# Patient Record
Sex: Female | Born: 1960 | Race: White | Hispanic: No | Marital: Married | Smoking: Former smoker
Health system: Southern US, Community
[De-identification: ages and names within clinical notes are randomized; demographics above are authoritative.]

## PROBLEM LIST (undated history)

## (undated) DIAGNOSIS — G510 Bell's palsy: Secondary | ICD-10-CM

## (undated) DIAGNOSIS — K589 Irritable bowel syndrome without diarrhea: Secondary | ICD-10-CM

## (undated) HISTORY — PX: SHOULDER SURGERY: SHX246

## (undated) HISTORY — PX: ABDOMINAL HYSTERECTOMY: SHX81

---

## 2007-09-17 ENCOUNTER — Ambulatory Visit: Payer: Self-pay | Admitting: Internal Medicine

## 2009-10-30 ENCOUNTER — Ambulatory Visit: Payer: Self-pay | Admitting: Family Medicine

## 2015-12-27 ENCOUNTER — Ambulatory Visit
Admission: EM | Admit: 2015-12-27 | Discharge: 2015-12-27 | Disposition: A | Discharge status: Home / Self Care | Payer: 59 | Attending: Family Medicine | Admitting: Family Medicine

## 2015-12-27 ENCOUNTER — Encounter: Payer: Self-pay | Admitting: Emergency Medicine

## 2015-12-27 DIAGNOSIS — T887XXA Unspecified adverse effect of drug or medicament, initial encounter: Secondary | ICD-10-CM

## 2015-12-27 DIAGNOSIS — R42 Dizziness and giddiness: Secondary | ICD-10-CM

## 2015-12-27 DIAGNOSIS — R55 Syncope and collapse: Secondary | ICD-10-CM | POA: Diagnosis not present

## 2015-12-27 MED ORDER — LORATADINE 10 MG PO TABS: 10.0000 mg | ORAL_TABLET | Freq: Every day | ORAL | Status: DC

## 2015-12-27 MED ORDER — RANITIDINE HCL 150 MG PO CAPS: 150.0000 mg | ORAL_CAPSULE | Freq: Two times a day (BID) | ORAL | Status: DC | PRN

## 2015-12-27 NOTE — ED Notes (Signed)
Patient states that she took Advil about ago and Benadryl at 6:30am this morning.  Patient also states that she took Imodium at 7:30am this morning.  Patient states that she started feeling dizzy and light headed around 10am this morning.  Patient reports nausea.  Patient denies fevers.  Patient reports lower back pain that has been ongoing.

## 2015-12-27 NOTE — ED Provider Notes (Signed)
CSN: [id number]     Arrival date & time 12/27/15  1021 History   First MD Initiated Contact with Patient 12/27/15 1120    Nurses notes were reviewed. Chief Complaint  Patient presents with  . Dizziness   Patient is here because of dizziness. She reports being dizzy and lightheaded this morning after taking medications. She takes Benadryl for a chronic rashes so dermatologist for last week, she started with some Imodium which she uses for recurrent diarrhea while she is being followed by another doctor for and then she has chronic back pain and she took some Advil and aspirin everything seemed to hear. She reports feeling lightheaded dizzy. Since she's been here she's felt better since sitting taking it easy and regular. She states is a history of heart disease the family and she was worried about being toxic we'll consider. She stop smoking by year ago but stated this history of heart disease in family she's had abdominal hysterectomy and shoulder surgery in the past.     (Consider location/radiation/quality/duration/timing/severity/associated sxs/prior Treatment) Patient is a 55 y.o. female presenting with dizziness. The history is provided by the patient. No language interpreter was used.  Dizziness Quality:  Lightheadedness Severity:  Moderate Onset quality:  Sudden Timing:  Constant Progression:  Partially resolved Chronicity:  New Context: medication   Relieved by:  Lying down Worsened by:  Nothing Associated symptoms: diarrhea     History reviewed. No pertinent past medical history. Past Surgical History  Procedure Laterality Date  . Abdominal hysterectomy    . Shoulder surgery Left    History reviewed. No pertinent family history. Social History  Substance Use Topics  . Smoking status: Former Games developer  . Smokeless tobacco: None  . Alcohol Use: No   OB History    No data available     Review of Systems  Gastrointestinal: Positive for diarrhea.  Musculoskeletal:  Positive for back pain.  Skin: Positive for rash.  Neurological: Positive for dizziness and light-headedness.  All other systems reviewed and are negative.   Allergies  Demerol  Home Medications   Prior to Admission medications   Medication Sig Start Date End Date Taking? Authorizing Provider  fluocinonide cream (LIDEX) 0.05 % Apply 1 application topically 2 (two) times daily.   Yes Historical Provider, MD   Meds Ordered and Administered this Visit  Medications - No data to display  BP 109/55 mmHg  Pulse 80  Temp(Src) 97.1 F (36.2 C) (Tympanic)  Resp 16  Ht  (1.651 m)  Wt 104 lb (47.174 kg)  BMI 17.31 kg/m2  SpO2 100% No data found.   Physical Exam  Constitutional: She is oriented to person, place, and time. She appears well-developed and well-nourished.  HENT:  Head: Normocephalic and atraumatic.  Right Ear: External ear normal.  Left Ear: External ear normal.  Mouth/Throat: Oropharynx is clear and moist.  Eyes: Conjunctivae and EOM are normal. Pupils are equal, round, and reactive to light. Right eye exhibits no discharge.  Neck: Normal range of motion. Neck supple. No tracheal deviation present. No thyromegaly present.  Cardiovascular: Normal rate, regular rhythm and normal heart sounds.   Pulmonary/Chest: Effort normal and breath sounds normal.  Abdominal: Soft.  Musculoskeletal: Normal range of motion.  Lymphadenopathy:    She has no cervical adenopathy.  Neurological: She is alert and oriented to person, place, and time.  Skin: Skin is warm and dry. Rash noted. No erythema.  Psychiatric: She has a normal mood and affect.  Vitals reviewed.  ED Course  Procedures (including critical care time)  Labs Review Labs Reviewed - No data to display  Imaging Review No results found.   Visual Acuity Review  Right Eye Distance:   Left Eye Distance:   Bilateral Distance:    Right Eye Near:   Left Eye Near:    Bilateral Near:         MDM   1.  Non-dose-related adverse reaction to medication, initial encounter   2. Dizzy spells    Recommend patient that she stopped the Benadryl as due to the multiple side effects C&S and otherwise the Benadryl can have. Strongly recommend she take Claritin 10 mg and and Zantac 150 twice a day for the itching when needed. In those prescriptions were sent into the pharmacy of her choice. We'll give her a note for work for today as well. Since she states she's feeling better no further therapy or modalities will be done EKG was a normal sinus rhythm.ED ECG REPORT I, Nafeesah Lapaglia H, the attending physician, personally viewed and interpreted this ECG.   Date: 12/27/2015 Time 11:35:47   Rate:71  Rhythm: normal EKG, normal sinus rhythm, there are no previous tracings available for comparison  Axis: 70  Intervals:none  ST&T Change: none     Note: This dictation was prepared with Dragon dictation along with smaller phrase technology. Any transcriptional errors that result from this process are unintentional.  Hassan Rowan, MD 12/27/15 [id number]

## 2015-12-27 NOTE — Discharge Instructions (Signed)
Dizziness Dizziness is a common problem. It makes you feel unsteady or lightheaded. You may feel like you are about to pass out (faint). Dizziness can lead to injury if you stumble or fall. Anyone can get dizzy, but dizziness is more common in older adults. This condition can be caused by a number of things, including:  Medicines.  Dehydration.  Illness. HOME CARE Following these instructions may help with your condition: Eating and Drinking  Drink enough fluid to keep your pee (urine) clear or pale yellow. This helps to keep you from getting dehydrated. Try to drink more clear fluids, such as water.  Do not drink alcohol.  Limit how much caffeine you drink or eat if told by your doctor.  Limit how much salt you drink or eat if told by your doctor. Activity  Avoid making quick movements.  When you stand up from sitting in a chair, steady yourself until you feel okay.  In the morning, first sit up on the side of the bed. When you feel okay, stand slowly while you hold onto something. Do this until you know that your balance is fine.  Move your legs often if you need to stand in one place for a long time. Tighten and relax your muscles in your legs while you are standing.  Do not drive or use heavy machinery if you feel dizzy.  Avoid bending down if you feel dizzy. Place items in your home so that they are easy for you to reach without leaning over. Lifestyle  Do not use any tobacco products, including cigarettes, chewing tobacco, or electronic cigarettes. If you need help quitting, ask your doctor.  Try to lower your stress level, such as with yoga or meditation. Talk with your doctor if you need help. General Instructions  Watch your dizziness for any changes.  Take medicines only as told by your doctor. Talk with your doctor if you think that your dizziness is caused by a medicine that you are taking.  Tell a friend or a family member that you are feeling dizzy. If he or  she notices any changes in your behavior, have this person call your doctor.  Keep all follow-up visits as told by your doctor. This is important. GET HELP IF:  Your dizziness does not go away.  Your dizziness or light-headedness gets worse.  You feel sick to your stomach (nauseous).  You have trouble hearing.  You have new symptoms.  You are unsteady on your feet or you feel like the room is spinning. GET HELP RIGHT AWAY IF:  You throw up (vomit) or have diarrhea and are unable to eat or drink anything.  You have trouble:  Talking.  Walking.  Swallowing.  Using your arms, hands, or legs.  You feel generally weak.  You are not thinking clearly or you have trouble forming sentences. It may take a friend or family member to notice this.  You have:  Chest pain.  Pain in your belly (abdomen).  Shortness of breath.  Sweating.  Your vision changes.  You are bleeding.  You have a headache.  You have neck pain or a stiff neck.  You have a fever.   This information is not intended to replace advice given to you by your health care provider. Make sure you discuss any questions you have with your health care provider.   Document Released: 10/05/2011 Document Revised: 03/02/2015 Document Reviewed: 10/12/2014 Elsevier Interactive Patient Education 2016 [employer].  Drug Toxicity Drug toxicity  refers to harmful and unwanted (adverse) effects of a drug in your body. Drug toxicity often results from taking too much of a drug (overdose) by accident or on purpose. With some drugs, there is only a small difference between the dose that is needed to treat your condition and a dose that is harmful (narrow therapeutic range). However, any drug can be toxic at high doses, and even normal doses of certain drugs can be toxic for some people. These include over-the-counter (OTC) medicines. Drug toxicity can happen suddenly when you first start taking a drug or when you suddenly  take too much of a drug (acute toxicity). It can also happen as a result of taking a drug for a long period of time (chronic toxicity). The effects of drug toxicity can be mild, dangerous, or even deadly. CAUSES Many things can cause drug toxicity. Common causes of acute toxicity include a drug overdose or an allergic reaction to a drug. Most drugs are broken down (metabolized) by your liver and eliminated (excreted) by your kidneys. Chronic drug toxicity can result from changes in the way that your body metabolizes a drug. This can happen, for example, if you weigh less than you did when you started taking a drug but you keep taking the same dose that you took at the heavier weight. RISK FACTORS You may have a higher risk for drug toxicity if you:  Are under 41 years of age or over 98 years of age.  Have liver disease, kidney disease, or another medical condition.  Are taking more than one drug.  Are pregnant.  Are allergic to certain drugs.  Have genes that cause you to be more affected by (susceptible to) certain drugs.  Take a drug that has a narrow therapeutic range. Certain types of drugs are more likely than others to cause toxicity. Many drugs have a narrow therapeutic range, including:  Blood thinners.  Heart medicines.  Diabetes medicines.  Medicines to prevent or stop seizures.  Theophylline for asthma.  Lithium for bipolar disorder. SYMPTOMS Signs and symptoms of drug toxicity depend on the drug and the amount that was taken. They may start suddenly or develop gradually over time. DIAGNOSIS Drug toxicity may be diagnosed based on your symptoms. Some drugs have known side effects that suggest toxicity. It is important that you tell health care provider about all of the drugs that you are taking and whether you have ever had a reaction to a drug. Your health care provider will do a physical exam. You may have tests to check for drug toxicity, including:  Blood tests  to measure the amount of the drug in your blood or to check for signs of kidney or liver damage.  Urine tests.  Other tests to check for organ damage. TREATMENT Treatment may include:  Stopping the drug.  Lowering the dose of the drug.  Switching to a different drug. You may also need treatment to stop or reverse the effects of the toxicity. These treatments depend on the drug that caused the toxicity, how severe the toxicity is, and which parts of your body are affected. HOME CARE INSTRUCTIONS  Take medicines only as directed by your health care provider. Always ask your health care provider to discuss the possible side effects of any new drug that you start taking.  Keep a list of all of the drugs that you take, including over-the-counter medicines. Bring this list with you to all of your medical visits.  Read the drug inserts  that come with your medicines.  Keep all follow-up visits as directed by your health care provider. This is important. SEEK MEDICAL CARE IF:  Your symptoms return.  You develop any new signs or symptoms when you are taking medicines.  You notice any signs that indicate that you are taking too much of your medicine, based on what your health care provider told you to watch for. SEEK IMMEDIATE MEDICAL CARE IF:  You have chest pain.  You have difficulty breathing.  You have a loss of consciousness.   This information is not intended to replace advice given to you by your health care provider. Make sure you discuss any questions you have with your health care provider.   Document Released: 10/16/2005 Document Revised: 03/02/2015 Document Reviewed: 10/21/2014 Elsevier Interactive Patient Education [employer]

## 2017-01-14 ENCOUNTER — Ambulatory Visit
Admission: EM | Admit: 2017-01-14 | Discharge: 2017-01-14 | Disposition: A | Discharge status: Home / Self Care | Payer: BLUE CROSS/BLUE SHIELD | Attending: Family Medicine | Admitting: Family Medicine

## 2017-01-14 ENCOUNTER — Encounter: Payer: Self-pay | Admitting: Gynecology

## 2017-01-14 DIAGNOSIS — N390 Urinary tract infection, site not specified: Secondary | ICD-10-CM

## 2017-01-14 HISTORY — DX: Irritable bowel syndrome, unspecified: K58.9

## 2017-01-14 HISTORY — DX: Bell's palsy: G51.0

## 2017-01-14 LAB — URINALYSIS, COMPLETE (UACMP) WITH MICROSCOPIC
Bilirubin Urine: NEGATIVE
GLUCOSE, UA: NEGATIVE mg/dL
KETONES UR: NEGATIVE mg/dL
Nitrite: POSITIVE — AB
PROTEIN: 30 mg/dL — AB
Specific Gravity, Urine: 1.025 (ref 1.005–1.030)
pH: 5.5 (ref 5.0–8.0)

## 2017-01-14 MED ORDER — NITROFURANTOIN MONOHYD MACRO 100 MG PO CAPS: 100.0000 mg | ORAL_CAPSULE | Freq: Two times a day (BID) | ORAL | 0 refills | Status: DC

## 2017-01-14 NOTE — ED Provider Notes (Signed)
CSN: [id number]     Arrival date & time 01/14/17  1109 History   First MD Initiated Contact with Patient 01/14/17 1207     Chief Complaint  Patient presents with  . Urinary Tract Infection   (Consider location/radiation/quality/duration/timing/severity/associated sxs/prior Treatment) HPI This a 56 year old female who presents with pressure and burning and urgency and urgency. She's had UTIs in the past and this feels very similar. Her last UTI was about 10 years ago. He has been trying to use Azo but every time she stops using it she has return of her symptoms. She denies any fever or chills. She has had some mild low back pain.      Past Medical History:  Diagnosis Date  . Bell's palsy   . IBS (irritable bowel syndrome)    Past Surgical History:  Procedure Laterality Date  . ABDOMINAL HYSTERECTOMY    . SHOULDER SURGERY Left    No family history on file. Social History  Substance Use Topics  . Smoking status: Former Games developer  . Smokeless tobacco: Never Used  . Alcohol use No   OB History    No data available     Review of Systems  Constitutional: Positive for activity change. Negative for chills, fatigue and fever.  Genitourinary: Positive for dysuria, frequency and urgency. Negative for vaginal discharge and vaginal pain.  All other systems reviewed and are negative.   Allergies  Demerol [meperidine]  Home Medications   Prior to Admission medications   Medication Sig Start Date End Date Taking? Authorizing Provider  ranitidine (ZANTAC) 150 MG capsule Take 1 capsule (150 mg total) by mouth 2 (two) times daily as needed for heartburn. 12/27/15  Yes Hassan Rowan, MD  fluocinonide cream (LIDEX) 0.05 % Apply 1 application topically 2 (two) times daily.    Historical Provider, MD  loratadine (CLARITIN) 10 MG tablet Take 1 tablet (10 mg total) by mouth daily. Take 1 tablet in the morning. As needed for itching. 12/27/15   Hassan Rowan, MD  nitrofurantoin,  macrocrystal-monohydrate, (MACROBID) 100 MG capsule Take 1 capsule (100 mg total) by mouth 2 (two) times daily. 01/14/17   Lutricia Feil, PA-C   Meds Ordered and Administered this Visit  Medications - No data to display  BP (!) 98/48 (BP Location: Left Arm)   Pulse 97   Temp 98.5 F (36.9 C) (Oral)   Resp 16   Ht 5\' 6"  (1.676 m)   Wt 113 lb (51.3 kg)   SpO2 98%   BMI 18.24 kg/m  No data found.   Physical Exam  Constitutional: She is oriented to person, place, and time. She appears well-developed and well-nourished. No distress.  HENT:  Head: Normocephalic and atraumatic.  Eyes: Pupils are equal, round, and reactive to light.  Neck: Normal range of motion.  Pulmonary/Chest: Effort normal and breath sounds normal. No respiratory distress. She has no wheezes. She has no rales.  Abdominal: Soft. Bowel sounds are normal. She exhibits no distension. There is no tenderness. There is no rebound and no guarding.  Musculoskeletal: Normal range of motion.  Neurological: She is alert and oriented to person, place, and time.  Skin: Skin is warm and dry. She is not diaphoretic.  Psychiatric: She has a normal mood and affect. Her behavior is normal. Thought content normal.  Nursing note and vitals reviewed.   Urgent Care Course     Procedures (including critical care time)  Labs Review Labs Reviewed  URINALYSIS, COMPLETE (UACMP) WITH MICROSCOPIC - Abnormal;  Notable for the following:       Result Value   Color, Urine ORANGE (*)    APPearance CLOUDY (*)    Hgb urine dipstick SMALL (*)    Protein, ur 30 (*)    Nitrite POSITIVE (*)    Leukocytes, UA MODERATE (*)    Squamous Epithelial / LPF 0-5 (*)    Bacteria, UA MANY (*)    All other components within normal limits  URINE CULTURE    Imaging Review No results found.   Visual Acuity Review  Right Eye Distance:   Left Eye Distance:   Bilateral Distance:    Right Eye Near:   Left Eye Near:    Bilateral Near:          MDM   1. Lower urinary tract infectious disease    Discharge Medication List as of 01/14/2017 12:17 PM    START taking these medications   Details  nitrofurantoin, macrocrystal-monohydrate, (MACROBID) 100 MG capsule Take 1 capsule (100 mg total) by mouth 2 (two) times daily., Starting Sun 01/14/2017, Normal      Plan: 1. Test/x-ray results and diagnosis reviewed with patient 2. rx as per orders; risks, benefits, potential side effects reviewed with patient 3. Recommend supportive treatment with Rest fluids. Cultures and sensitivities of the urine sample should be available in 48 hours. 4. F/u prn if symptoms worsen or don't improve     Lutricia FeilWilliam P Roemer, PA-C 01/14/17 1226

## 2017-01-14 NOTE — ED Triage Notes (Signed)
Per patient pressure and burning with urination.

## 2017-01-17 ENCOUNTER — Telehealth: Payer: Self-pay

## 2017-01-17 LAB — URINE CULTURE: Culture: >=100000 — AB

## 2017-01-17 MED ORDER — ONDANSETRON 4 MG PO TBDP: 4.0000 mg | ORAL_TABLET | Freq: Three times a day (TID) | ORAL | 0 refills | Status: DC | PRN

## 2017-01-17 NOTE — Telephone Encounter (Signed)
Spoke with pt. Pt. Advised that she is having some nausea with medication. Renford DillsLindsey Miller, NP approved [facility] [place]. Patient aware.

## 2017-11-29 ENCOUNTER — Other Ambulatory Visit: Payer: Self-pay

## 2017-11-29 ENCOUNTER — Ambulatory Visit
Admission: EM | Admit: 2017-11-29 | Discharge: 2017-11-29 | Disposition: A | Discharge status: Home / Self Care | Payer: BLUE CROSS/BLUE SHIELD | Attending: Family Medicine | Admitting: Family Medicine

## 2017-11-29 ENCOUNTER — Ambulatory Visit (INDEPENDENT_AMBULATORY_CARE_PROVIDER_SITE_OTHER): Payer: BLUE CROSS/BLUE SHIELD

## 2017-11-29 DIAGNOSIS — M546 Pain in thoracic spine: Secondary | ICD-10-CM | POA: Insufficient documentation

## 2017-11-29 DIAGNOSIS — R0789 Other chest pain: Secondary | ICD-10-CM | POA: Diagnosis not present

## 2017-11-29 DIAGNOSIS — M549 Dorsalgia, unspecified: Secondary | ICD-10-CM | POA: Diagnosis present

## 2017-11-29 DIAGNOSIS — M5489 Other dorsalgia: Secondary | ICD-10-CM

## 2017-11-29 LAB — CBC WITH DIFFERENTIAL/PLATELET
BASOS ABS: 0.1 10*3/uL (ref 0–0.1)
Basophils Relative: 1 %
EOS ABS: 0.1 10*3/uL (ref 0–0.7)
EOS PCT: 1 %
HCT: 37 % (ref 35.0–47.0)
Hemoglobin: 12.6 g/dL (ref 12.0–16.0)
Lymphocytes Relative: 11 %
Lymphs Abs: 1 10*3/uL (ref 1.0–3.6)
MCH: 30 pg (ref 26.0–34.0)
MCHC: 33.9 g/dL (ref 32.0–36.0)
MCV: 88.4 fL (ref 80.0–100.0)
MONO ABS: 0.4 10*3/uL (ref 0.2–0.9)
Monocytes Relative: 5 %
Neutro Abs: 7.5 10*3/uL — ABNORMAL HIGH (ref 1.4–6.5)
Neutrophils Relative %: 82 %
PLATELETS: 277 10*3/uL (ref 150–440)
RBC: 4.19 MIL/uL (ref 3.80–5.20)
RDW: 13 % (ref 11.5–14.5)
WBC: 9.1 10*3/uL (ref 3.6–11.0)

## 2017-11-29 LAB — BASIC METABOLIC PANEL
ANION GAP: 7 (ref 5–15)
BUN: 13 mg/dL (ref 6–20)
CALCIUM: 8.8 mg/dL — AB (ref 8.9–10.3)
CO2: 28 mmol/L (ref 22–32)
Chloride: 103 mmol/L (ref 101–111)
Creatinine, Ser: 1.08 mg/dL — ABNORMAL HIGH (ref 0.44–1.00)
GFR calc Af Amer: >60 mL/min (ref >60)
GFR, EST NON AFRICAN AMERICAN: 56 mL/min — AB (ref >60)
Glucose, Bld: 96 mg/dL (ref 65–99)
POTASSIUM: 4 mmol/L (ref 3.5–5.1)
SODIUM: 138 mmol/L (ref 135–145)

## 2017-11-29 LAB — TROPONIN I

## 2017-11-29 MED ORDER — MELOXICAM 7.5 MG PO TABS: 7.5000 mg | ORAL_TABLET | Freq: Every day | ORAL | 0 refills | Status: AC

## 2017-11-29 MED ORDER — CYCLOBENZAPRINE HCL 10 MG PO TABS: 10.0000 mg | ORAL_TABLET | Freq: Two times a day (BID) | ORAL | 0 refills | Status: AC | PRN

## 2017-11-29 NOTE — ED Triage Notes (Addendum)
Pt with 2 days of pain in upper mid back. Worse last night and this a.m. Pain described as dull sometimes and sharp sometimes.  Pain 9/10. Hard to take a deep breath. Denies recent illness or injury. Pt walking very stiffly. Certain positions offer some comfort

## 2017-11-29 NOTE — ED Provider Notes (Addendum)
[facility] ____________________________________________  Time seen: Approximately 8:40 AM  I have reviewed the triage vital signs and the nursing notes.   HISTORY  Chief Complaint Back Pain  HPI Desiree Burns is a 57 y.o. female presenting for evaluation of left posterior back pain that is been present for the last 2 days.  Reports 2 days ago she noticed left back pain while she was at work.  States that she works at [employer] with a lot of arm movement.  States that the pain has gradually worsened that day and has remained constant.  Denies any fall, direct injury or known trigger.  States often has low back pain but not upper.  Denies any pain radiation, paresthesias, trauma, rash, decreased strength, unilateral weakness, fall, chest pain, shortness of breath.  States did have some left shoulder discomfort accompanying as well.  States tried taken Advil this morning without change.  States if she sits upright in a still position pain mostly resolves, and states certain positions help pain.  Pain is fully reproducible by direct palpation as well as certain movements.  Patient states that she has to sit guarding her back to avoid pain.  States pain is currently moderate.  Denies any other pain or recent changes.  Denies chest pain, shortness of breath, abdominal pain, dysuria, extremity pain, extremity swelling or rash. Denies recent sickness. Denies recent antibiotic use.   Foley, Shireen Quan, MD: PCP   Past Medical History:  Diagnosis Date  . Bell's palsy   . IBS (irritable bowel syndrome)     There are no active problems to display for this patient.   Past Surgical History:  Procedure Laterality Date  . ABDOMINAL HYSTERECTOMY    . SHOULDER SURGERY Left      No current facility-administered medications for this encounter.   Current Outpatient Medications:  .  loperamide (IMODIUM) 2 MG capsule, Take by mouth as needed for diarrhea or loose stools., Disp: , Rfl:  .   cyclobenzaprine (FLEXERIL) 10 MG tablet, Take 1 tablet (10 mg total) by mouth 2 (two) times daily as needed for muscle spasms. Do not drive while taking as can cause drowsiness, Disp: 15 tablet, Rfl: 0 .  meloxicam (MOBIC) 7.5 MG tablet, Take 1 tablet (7.5 mg total) by mouth daily., Disp: 10 tablet, Rfl: 0  Allergies Demerol [meperidine]  Family History  Problem Relation Age of Onset  . Stroke Mother   . Heart attack Father     Social History Social History   Tobacco Use  . Smoking status: Former Games developer  . Smokeless tobacco: Never Used  Substance Use Topics  . Alcohol use: No  . Drug use: No    Review of Systems Constitutional: No fever/chills Cardiovascular: Denies chest pain. Respiratory: Denies shortness of breath. Gastrointestinal: No abdominal pain.  Genitourinary: Negative for dysuria. Musculoskeletal: Positive for back pain. Skin: Negative for rash. Neurological: Negative for headaches, focal weakness or numbness.   ____________________________________________   PHYSICAL EXAM:  VITAL SIGNS: ED Triage Vitals [11/29/17 0818]  Enc Vitals Group     BP 112/62     Pulse Rate 83     Resp 16     Temp 97.8 F (36.6 C)     Temp src      SpO2 100 %     Weight 117 lb (53.1 kg)     Height 5\' 6"  (1.676 m)     Head Circumference      Peak Flow  Pain Score 9     Pain Loc      Pain Edu?      Excl. in GC?     Constitutional: Alert and oriented. Well appearing and in no acute distress. Eyes: Conjunctivae are normal.  ENT      Head: Normocephalic and atraumatic.      Mouth/Throat: Mucous membranes are moist.Oropharynx non-erythematous. Cardiovascular: Normal rate, regular rhythm. Grossly normal heart sounds.  Good peripheral circulation. Respiratory: Normal respiratory effort without tachypnea nor retractions. Breath sounds are clear and equal bilaterally. No wheezes, rales, rhonchi. Gastrointestinal: Soft and nontender. No distention. No CVA  tenderness. Musculoskeletal:  Nontender with normal range of motion in all extremities. No midline cervical, thoracic or lumbar tenderness to palpation. Bilateral distal radial pulses equal and easily palpated. Steady gait.  Except: Left posterior back left parathoracic along medial scapular margin moderate tenderness to direct palpation, no swelling, no ecchymosis, no rash, no point bony tenderness, pain fully reproducible by palpation per patient, bilateral upper extremities full range of motion present.  Neurologic:  Normal speech and language. No gross focal neurologic deficits are appreciated. Speech is normal. No gait instability.  Bilateral hand grip strong and equal.  No paresthesias to bilateral face, bilateral upper and bilateral lower extremities. Skin:  Skin is warm, dry and intact. No rash noted. Psychiatric: Mood and affect are normal. Speech and behavior are normal. Patient exhibits appropriate insight and judgment   ___________________________________________   LABS (all labs ordered are listed, but only abnormal results are displayed)  Labs Reviewed  CBC WITH DIFFERENTIAL/PLATELET - Abnormal; Notable for the following components:      Result Value   Neutro Abs 7.5 (*)    All other components within normal limits  BASIC METABOLIC PANEL - Abnormal; Notable for the following components:   Creatinine, Ser 1.08 (*)    Calcium 8.8 (*)    GFR calc non Af Amer 56 (*)    All other components within normal limits  TROPONIN I   ____________________________________________  EKG  ED ECG REPORT I, Renford DillsLindsey Yasir Kitner, the attending provider, personally viewed and interpreted this ECG.   Date: 11/29/2017  EKG Time: 0851  Rate: 75  Rhythm:  normal sinus rhythm  Axis: normal  Intervals:none  ST&T Change: no ST or T wave changes noted.   Unchanged from 12/27/15  ____________________________________________  RADIOLOGY  Dg Chest 2 View  Result Date: 11/29/2017 CLINICAL DATA:  Pt  states she has been having left upper post chest pain x 2 days. exsmoker EXAM: CHEST  2 VIEW COMPARISON:  None. FINDINGS: Normal mediastinum and cardiac silhouette. Normal pulmonary vasculature. No evidence of effusion, infiltrate, or pneumothorax. Nipple shadows noted no acute bony abnormality. IMPRESSION: Normal chest radiograph Electronically Signed   By: Genevive BiStewart  Edmunds M.D.   On: 11/29/2017 09:06   ____________________________________________   PROCEDURES Procedures     INITIAL IMPRESSION / ASSESSMENT AND PLAN / ED COURSE  Pertinent labs & imaging results that were available during my care of the patient were reviewed by me and considered in my medical decision making (see chart for details).  Well-appearing patient.  No acute distress.  Patient sitting guarding position to avoid increased pain to her back.  Patient reports 2 days of left posterior parathoracic pain, no trauma.  Pain fully reproducible per patient during exam by direct palpation.  Denies any radiating chest pain or shortness of breath.  Suspect musculoskeletal pain.  Patient expressed concern of a cardiac influence.  Will evaluate EKG,  chest x-ray, CBC, BMP and troponin.  Checks x-ray as above per radiology, negative.  EKG unremarkable.  Labs reviewed, and discussed with patient.  Suspect musculoskeletal pain.  Encourage rest, fluids, supportive care, stretching.  Will treat with oral daily Mobic and as needed Flexeril.  Discussed follow-up closely with primary care.  Discussed strict follow-up and return parameters, including for any worsening proceeding to the ER.  Discussed follow up with Primary care physician this week. Discussed follow up and return parameters including no resolution or any worsening concerns. Patient verbalized understanding and agreed to plan.   ____________________________________________   FINAL CLINICAL IMPRESSION(S) / ED DIAGNOSES  Final diagnoses:  Acute left-sided thoracic back pain      ED Discharge Orders        Ordered    meloxicam (MOBIC) 7.5 MG tablet  Daily     11/29/17 0925    cyclobenzaprine (FLEXERIL) 10 MG tablet  2 times daily PRN     11/29/17 0102       Note: This dictation was prepared with Dragon dictation along with smaller phrase technology. Any transcriptional errors that result from this process are unintentional.         Renford Dills, NP 11/29/17 7253    Renford Dills, NP 11/29/17 [phone]

## 2017-11-29 NOTE — Discharge Instructions (Signed)
Take medication as prescribed. Rest. Drink plenty of fluids.   Follow up with your primary care physician this week. Return to Urgent care or proceed to Emergency room for new or worsening concerns.

## 2017-12-02 ENCOUNTER — Telehealth: Payer: Self-pay

## 2017-12-02 NOTE — Telephone Encounter (Signed)
Called to follow up with patient since visit here at [facility]. Patient instructed to call back with any questions or concerns. MAH  

## 2019-02-05 IMAGING — CR DG CHEST 2V
2 series · 2 of 2 positions shown · non-contrast
Comparison: None.

CLINICAL DATA: Pt states she has been having left upper post chest
pain x 2 days. exsmoker

EXAM:
CHEST  2 VIEW

[chest pa]
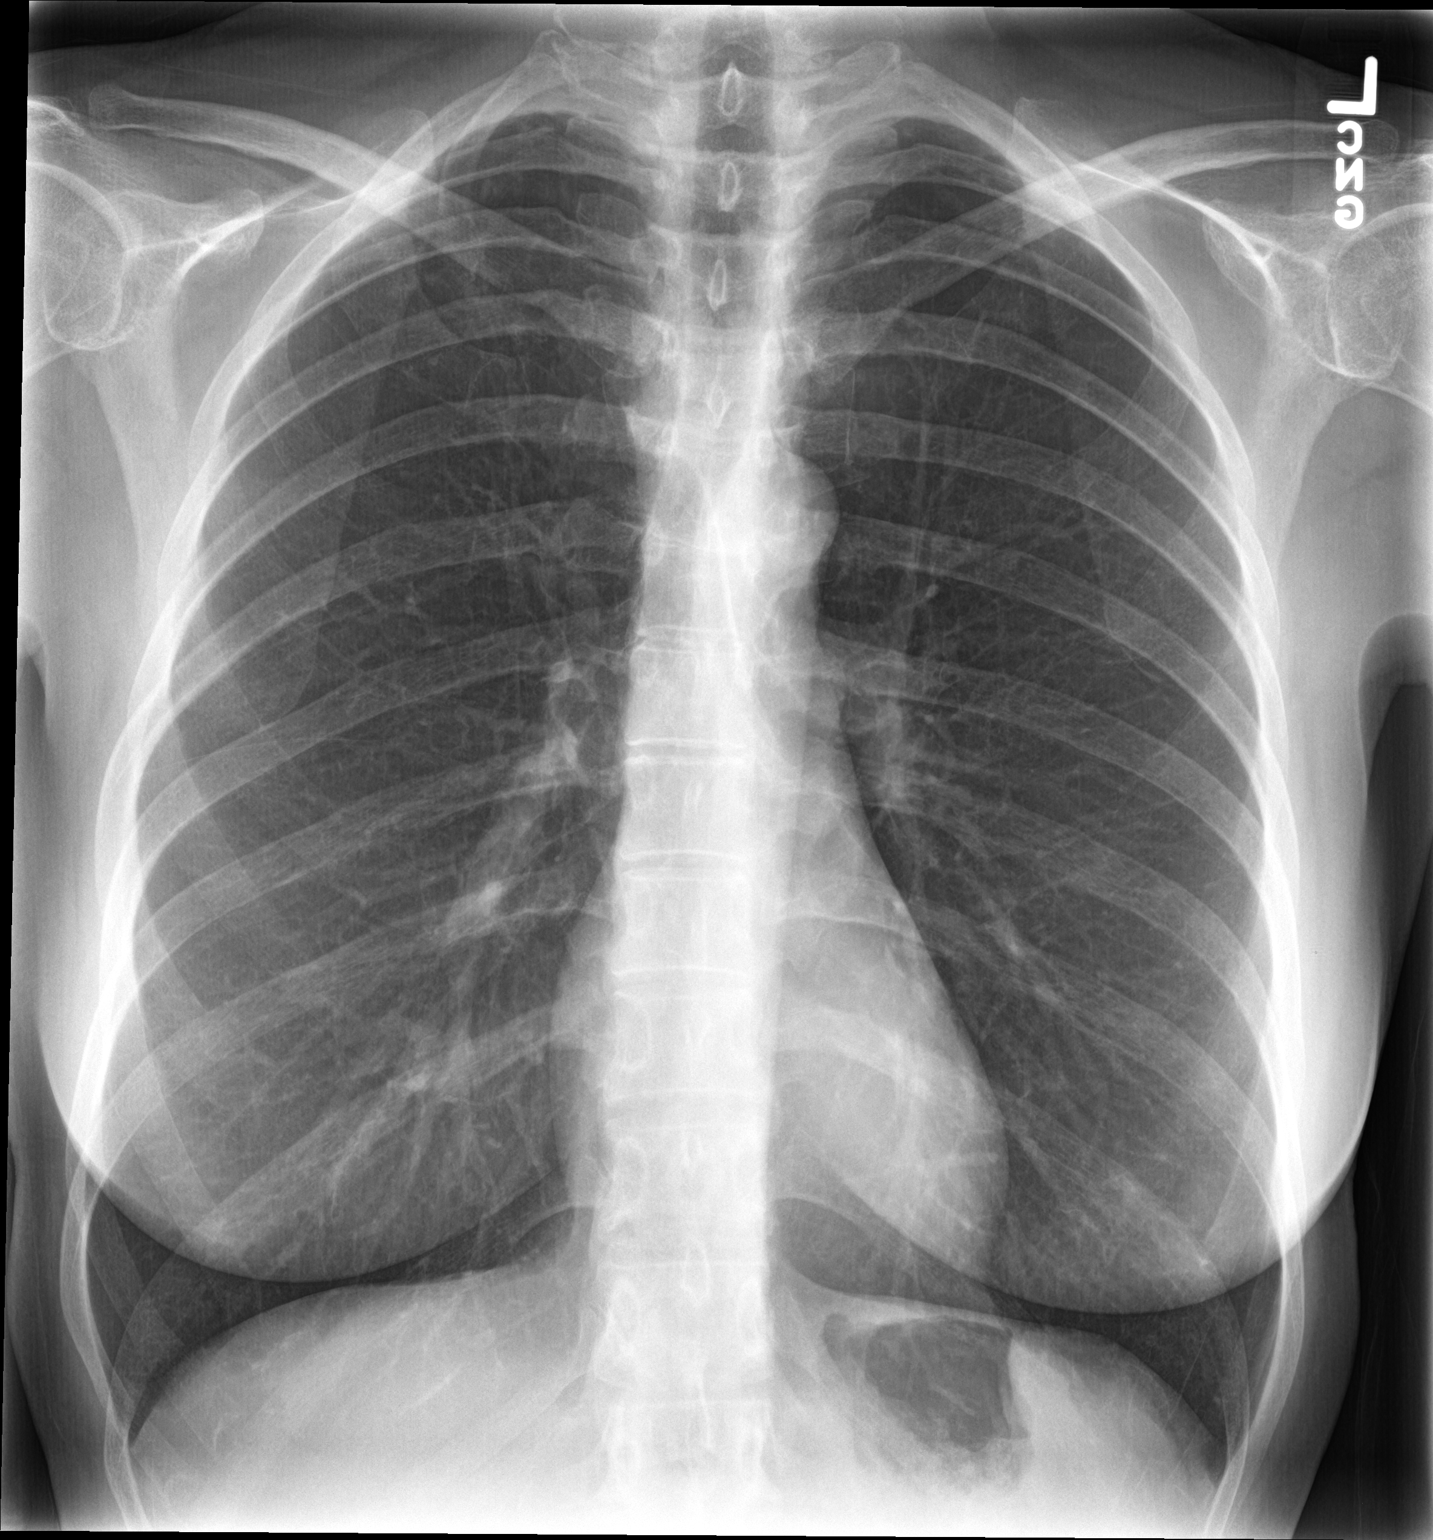

[chest lat]
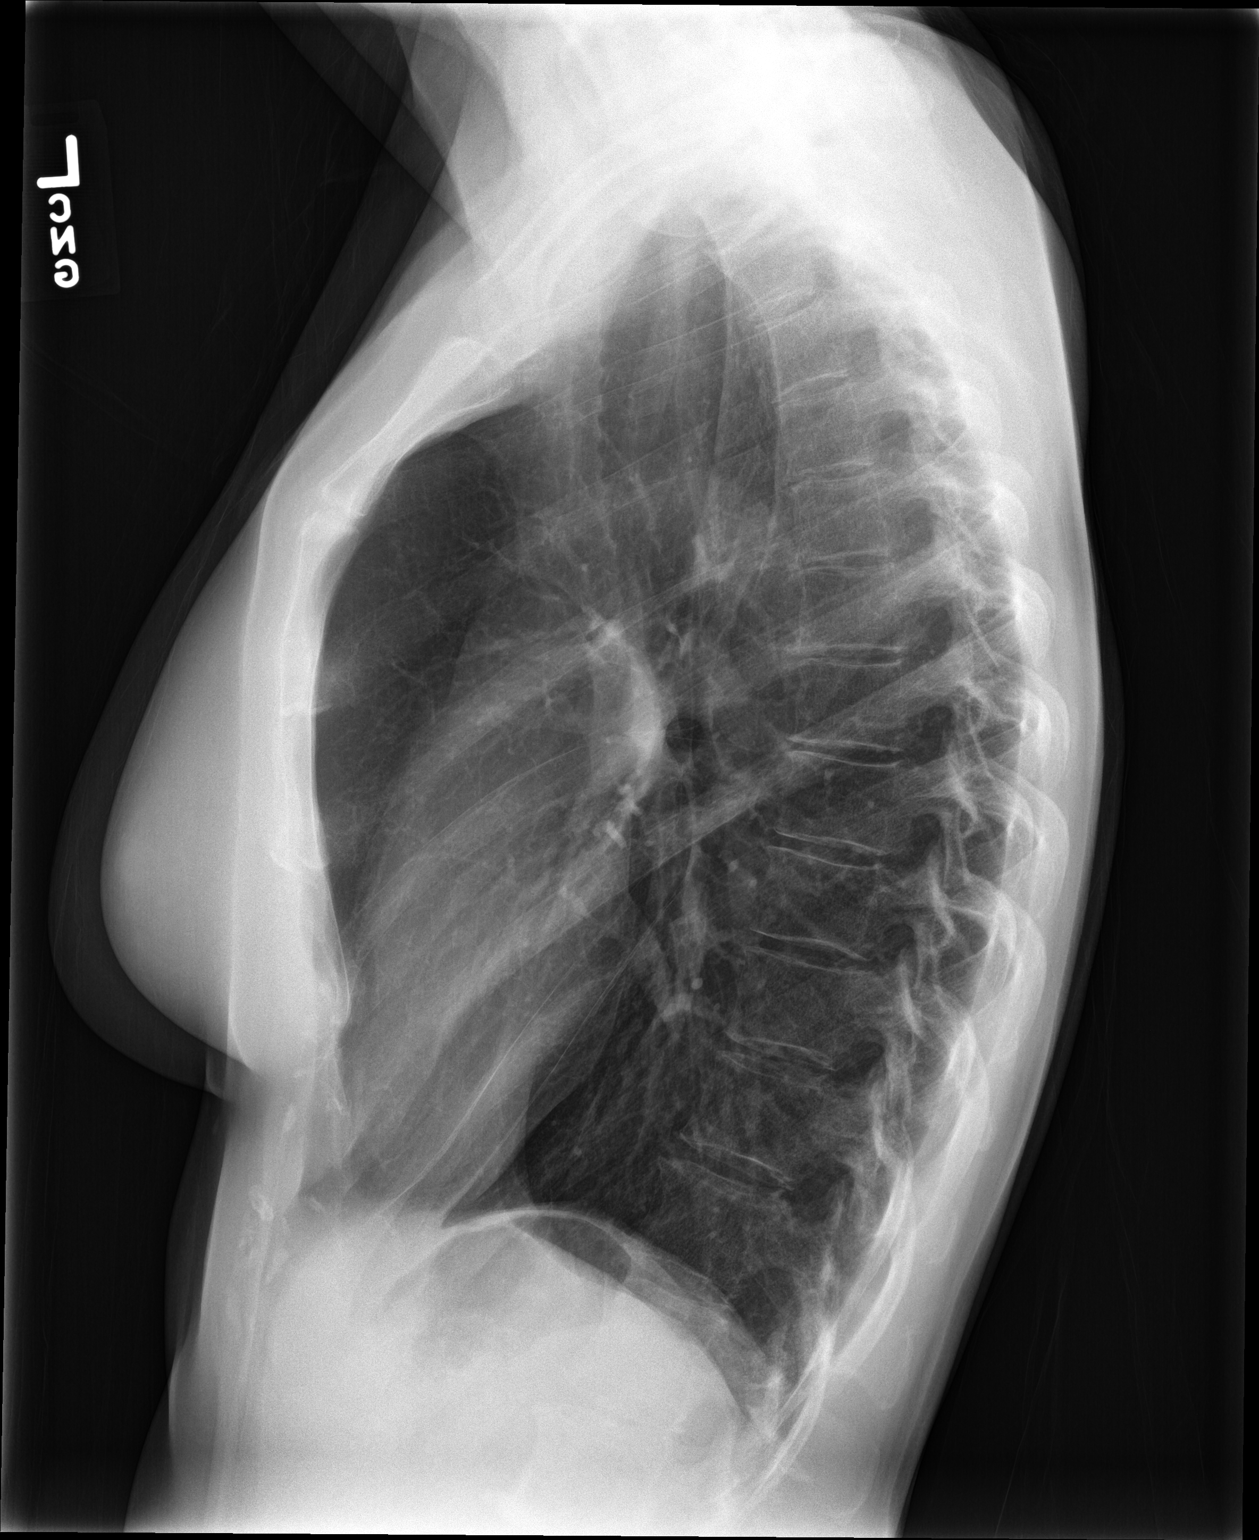

[2 of 2 positions shown; findings below may reference images not displayed]

FINDINGS: Normal mediastinum and cardiac silhouette. Normal pulmonary
vasculature. No evidence of effusion, infiltrate, or pneumothorax.
Nipple shadows noted no acute bony abnormality.
IMPRESSION: Normal chest radiograph

## 2019-07-01 DEATH — deceased

## 2020-10-18 ENCOUNTER — Ambulatory Visit
Admission: EM | Admit: 2020-10-18 | Discharge: 2020-10-18 | Disposition: A | Discharge status: Home / Self Care | Payer: BC Managed Care – PPO | Attending: Physician Assistant | Admitting: Physician Assistant

## 2020-10-18 ENCOUNTER — Encounter: Payer: Self-pay | Admitting: Emergency Medicine

## 2020-10-18 ENCOUNTER — Other Ambulatory Visit: Payer: Self-pay

## 2020-10-18 DIAGNOSIS — R300 Dysuria: Secondary | ICD-10-CM | POA: Diagnosis not present

## 2020-10-18 DIAGNOSIS — N3000 Acute cystitis without hematuria: Secondary | ICD-10-CM | POA: Diagnosis not present

## 2020-10-18 LAB — URINALYSIS, COMPLETE (UACMP) WITH MICROSCOPIC
Bilirubin Urine: NEGATIVE
Glucose, UA: NEGATIVE mg/dL
Hgb urine dipstick: NEGATIVE
Ketones, ur: NEGATIVE mg/dL
Nitrite: POSITIVE — AB
Protein, ur: NEGATIVE mg/dL
RBC / HPF: NONE SEEN RBC/hpf (ref 0–5)
Specific Gravity, Urine: 1.01 (ref 1.005–1.030)
WBC, UA: >50 WBC/hpf (ref 0–5)
pH: 5.5 (ref 5.0–8.0)

## 2020-10-18 MED ORDER — NITROFURANTOIN MONOHYD MACRO 100 MG PO CAPS: 100.0000 mg | ORAL_CAPSULE | Freq: Two times a day (BID) | ORAL | 0 refills | Status: AC

## 2020-10-18 MED ORDER — PHENAZOPYRIDINE HCL 200 MG PO TABS: 200.0000 mg | ORAL_TABLET | Freq: Three times a day (TID) | ORAL | 0 refills | Status: AC

## 2020-10-18 NOTE — Discharge Instructions (Addendum)

## 2020-10-18 NOTE — ED Triage Notes (Signed)
Pt c/o dysuria, urinary retention. Started about 5 days ago. She states she feels feverish but has not checked her temperature. Denies lower back pain.

## 2020-10-18 NOTE — ED Provider Notes (Signed)
[facility]    CSN: [id number] Arrival date & time: 10/18/20  1635      History   Chief Complaint Chief Complaint  Patient presents with  . Dysuria    HPI Desiree Burns is a 59 y.o. female presenting for 5-day history of dysuria and decreased urination.  Patient states she is also felt feverish and fatigued.  She denies any urinary frequency, urgency, hematuria, vaginal discharge or itching.  Denies any chills, weakness, abdominal pain, lower back pain.  Patient states that she lost her brother couple weeks ago so she has not been feeling well overall.  She also states that she had the Covid booster 1-1/2 weeks ago.  Patient has been taking over-the-counter Azo here and there for symptoms and says that does help.  She states that the symptoms do return however.  She has no other complaints or concerns.  HPI  Past Medical History:  Diagnosis Date  . Bell's palsy   . IBS (irritable bowel syndrome)     There are no problems to display for this patient.   Past Surgical History:  Procedure Laterality Date  . ABDOMINAL HYSTERECTOMY    . SHOULDER SURGERY Left     OB History   No obstetric history on file.      Home Medications    Prior to Admission medications   Medication Sig Start Date End Date Taking? Authorizing Provider  cyclobenzaprine (FLEXERIL) 10 MG tablet Take 1 tablet (10 mg total) by mouth 2 (two) times daily as needed for muscle spasms. Do not drive while taking as can cause drowsiness 11/29/17   Renford Dills, NP  loperamide (IMODIUM) 2 MG capsule Take by mouth as needed for diarrhea or loose stools.    [provider]  meloxicam (MOBIC) 7.5 MG tablet Take 1 tablet (7.5 mg total) by mouth daily. 11/29/17   Renford Dills, NP  omeprazole (PRILOSEC) 40 MG capsule Take 40 mg by mouth daily.    [provider]    Family History Family History  Problem Relation Age of Onset  . Stroke Mother   . Heart attack Father     Social  History Social History   Tobacco Use  . Smoking status: Former Games developer  . Smokeless tobacco: Never Used  Vaping Use  . Vaping Use: Never used  Substance Use Topics  . Alcohol use: No  . Drug use: No     Allergies   Demerol [meperidine]   Review of Systems Review of Systems  Constitutional: Negative for chills, fatigue and fever.  Gastrointestinal: Negative for abdominal pain, diarrhea, nausea and vomiting.  Genitourinary: Positive for decreased urine volume and dysuria. Negative for flank pain, frequency, hematuria, pelvic pain, urgency, vaginal bleeding, vaginal discharge and vaginal pain.  Musculoskeletal: Negative for back pain.  Skin: Negative for rash.     Physical Exam Triage Vital Signs ED Triage Vitals  Enc Vitals Group     BP 10/18/20 1736 (!) 120/54     Pulse Rate 10/18/20 1736 76     Resp 10/18/20 1736 18     Temp 10/18/20 1736 98.4 F (36.9 C)     Temp Source 10/18/20 1736 Oral     SpO2 10/18/20 1736 98 %     Weight 10/18/20 1734 117 lb 1 oz (53.1 kg)     Height 10/18/20 1734 5\' 6"  (1.676 m)     Head Circumference --      Peak Flow --      Pain  Score 10/18/20 1734 4     Pain Loc --      Pain Edu? --      Excl. in GC? --    No data found.  Updated Vital Signs BP (!) 120/54 (BP Location: Right Arm)   Pulse 76   Temp 98.4 F (36.9 C) (Oral)   Resp 18   Ht 5\' 6"  (1.676 m)   Wt 117 lb 1 oz (53.1 kg)   SpO2 98%   BMI 18.89 kg/m        Physical Exam Vitals and nursing note reviewed.  Constitutional:      General: She is not in acute distress.    Appearance: Normal appearance. She is not ill-appearing or toxic-appearing.  HENT:     Head: Normocephalic and atraumatic.  Eyes:     General: No scleral icterus.       Right eye: No discharge.        Left eye: No discharge.     Conjunctiva/sclera: Conjunctivae normal.  Cardiovascular:     Rate and Rhythm: Normal rate and regular rhythm.     Heart sounds: Normal heart sounds.  Pulmonary:      Effort: Pulmonary effort is normal. No respiratory distress.     Breath sounds: Normal breath sounds.  Abdominal:     Palpations: Abdomen is soft.     Tenderness: There is abdominal tenderness (mild suprapubic). There is no right CVA tenderness or left CVA tenderness.  Musculoskeletal:     Cervical back: Neck supple.  Skin:    General: Skin is dry.  Neurological:     General: No focal deficit present.     Mental Status: She is alert. Mental status is at baseline.     Motor: No weakness.     Gait: Gait normal.  Psychiatric:        Mood and Affect: Mood normal.        Behavior: Behavior normal.        Thought Content: Thought content normal.      UC Treatments / Results  Labs (all labs ordered are listed, but only abnormal results are displayed) Labs Reviewed  URINALYSIS, COMPLETE (UACMP) WITH MICROSCOPIC - Abnormal; Notable for the following components:      Result Value   APPearance HAZY (*)    Nitrite POSITIVE (*)    Leukocytes,Ua SMALL (*)    Bacteria, UA MANY (*)    All other components within normal limits  URINE CULTURE    EKG   Radiology No results found.  Procedures Procedures (including critical care time)  Medications Ordered in UC Medications - No data to display  Initial Impression / Assessment and Plan / UC Course  I have reviewed the triage vital signs and the nursing notes.  Pertinent labs & imaging results that were available during my care of the patient were reviewed by me and considered in my medical decision making (see chart for details).   59 year old female with 5-day history of dysuria and decreased urination.  Urinalysis is positive for nitrites and small leukocytes.  Urine sent for culture.  Treating for UTI at this time with Macrobid and Pyridium.  Advised increased rest and fluids.  Return and ED precautions discussed with patient.   Final Clinical Impressions(s) / UC Diagnoses   Final diagnoses:  Acute cystitis without hematuria   Dysuria     Discharge Instructions     UTI: Based on either symptoms or urinalysis, you may have a urinary tract  infection. We will send the urine for culture and call with results in a few days. Begin antibiotics at this time. Your symptoms should be much improved over the next 2-3 days. Increase rest and fluid intake. If for some reason symptoms are worsening or not improving after a couple of days or the urine culture determines the antibiotics you are taking will not treat the infection, the antibiotics may be changed. Return or go to ER for fever, back pain, worsening urinary pain, discharge, increased blood in urine. May take Tylenol or Motrin OTC for pain relief or consider AZO if no contraindications     ED Prescriptions    None     PDMP not reviewed this encounter.   Shirlee Latch, PA-C 10/18/20 1810

## 2020-10-20 LAB — URINE CULTURE: Culture: >=100000 — AB

## 2023-08-30 NOTE — Unmapped External Note (Signed)
 Patient Name: Desiree Burns, Desiree Burns  MRN:  [id number] Date of Birth: May 05, 1961  Date of Service:  08/30/2023   Full Operative Note   Date of Surgery: 08/30/2023  Preoperative diagnosis: right upper lobe adenocarcinoma  Postoperative diagnosis: same    Procedure: Right Robotic Assisted Thoracoscopic Surgery, Right upper lobectomy,  mediastinal lymph node dissection, multilevel rib block, right middle lobe pexy Surgeons: Surgeon(s): Barbette Allyne Case, MD Merrianne Evalene CROME, MD Buckeridge, Elspeth LABOR, MD  Anesthesiologists: Anesthesiologist: Simmie Lorrene Heinz, MD; Enedelia Marvel Ip, MD Anesthesia Provider: Theophilus Burnet, Aloysius Pour, CRNA; Razzaq, Khadija K, MD      Anesthesia Type: General endotracheal tube anesthesia.   ASA Code: IV   Estimated Blood Loss: 75 cc  Indications: Desiree Burns is a 62 y.o.  woman who presented with RUL PET avid mass and hilar node. 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 Therefore resection was recommended.  Findings: right upper lobe with 3.2 cm adenocarcinoma and negative bronchial margin   Description of Procedure: The patient was brought to the operating room and placed supine on the OR table. Time outs were done. General anesthesia was induced.   A double lumen endotracheal tube was placed. The patient was then positioned in left lateral decubitus position.  The right chest was sterilely prepped and draped.  Time out were again done.   Next, we made four 1 cm incisions along the seventh intercostal space from anterior to posterior with three 8 mm ports and one 12 mm ports.  An additional 12 mm port was placed posteriorly just above the diaphragm.  The robotic 30 degree camera was inserted. Upon insertion of the camera,  CO2 insufflation was initiated to approximately a pressure of 12 mmHg.   A multilevel intercostal nerve block was done from T3 to T10. Following this, the DaVinci Xi robot was then docked to the robotic  ports.   Cadiere forceps were used to retract the right upper lobe and divide an apical adhesion.  The chest was explored. There were no implants. The right upper lobe adenocarcinoma was visualized.   We then elected to perform a completion lobectomy.  Next, using [place]  biopolar cautery, the inferior pulmonary ligament was divided.  Level 9R lymph nodes were  present and removed in a glove tip and sent for permanent. Level 8R lymph nodes were  present and collected.   The posterior mediastinal pleura was divided to the level of the azygous vein with the biplar forceps.  Level 7 lymph nodes were identified and sampled and sent for permanent.  The right upper lobe bronchus and bronchus intermedius were identified and dissected posteriorly.  A level 11R lymph node was extremely adherent here. The right paratracheal space was identified after retracting the lung inferiorly.  Level 4R lymph nodes were identified and sampled and sent for permanent.  The mediastinal pleura of the superior hilum was divided with bipolar cautery.  The right upper lobe truncus arterial branches were identified and dissected. Level 10R and 11R anterior lymph nodes were identified, dissected, and sent for permanent.  The right upper lobe vein was identified and dissected with the bioplar forceps.  The middle lobe vein was identified and preserved. Next, the fissure was oepend and a posterior ascending arterial branch was identified.  The lung was retracted anteriorly and the posterior fissure was again dissected, removing the adherent 12R posterior lymph node.  The posterior fissure was encircled and divided with a green load stapler.  The superior vein branches to the  upper lobe were encircled with a vessel loop and divided with a white load DaVinci robotic stapler. The two truncus arterious branches were  encircled with a vessel loop and divided with a white load DaVinci robotic stapler stapler, protecting the ongoing PA.  The right  upper lobe bronchus was then encircled and clamped with a green load DaVinci stapler.  Breaths given to the right lung demonstrated re-expansion of the RML and RLL.  The right upper lobe bronchus was divided.  The anterior fissure was completed with serial firings of a green load DaVinci robotic stapler.  The middle lobe artery was identified and preserved. The specimen was placed in an endocatch bag and sent for frozen with above results.   Pexy of the right middle lobe to the right lower lobe was done using serial firings of the green stapler, ensuring proper orientation and no torsion. This specimen was sent for permament section.  The port sites were examined for bleeding. A bleeding bronchial artery along the bronchus was clipped. A Level 7 lymph node was oozing and controlled with fibrillar. Hemostasis was achieved.   A 28 Fr chest tube was placed into the apex of the right chest from the camera port and secured to the chest wall with interrupted Neurolon sutures. A blake drain was placed in the anterior port along the anterior chest. The lung was re-expanded.  The robot was undocked from the patient and all ports were removed.  The remaining volume of liposomal bupivicaine was injected around the incisions. The assist port was closed with 0 Vicryl suture and all the incisions were closed with running 2-0 and 4-0 Vicryl sutures.  The chest tube was placed to suction on the Pleurovac.  Sterile dressings were applied.  The drapes were removed.  The patient was repositioned supine and extubated.    Sponge and instrument counts were correct x 2. The patient left the operating room in stable condition.  Specimens:  ID Type Source Tests Collected by Time Destination  1 : 9R Tissue Lymph Node SURGICAL PATHOLOGY ERASMO Barbette Allyne Michelina, MD 08/30/2023 1447   2 : Level 7 Tissue Lymph Node SURGICAL PATHOLOGY EXAM Barbette Allyne Michelina, MD 08/30/2023 1447   3 : 8R Tissue Lymph Node SURGICAL PATHOLOGY EXAM Barbette Allyne Michelina, MD 08/30/2023 1447   4 : 10R Tissue Lymph Node SURGICAL PATHOLOGY EXAM Barbette Allyne Michelina, MD 08/30/2023 1523   5 : 4R Tissue Lymph Node SURGICAL PATHOLOGY EXAM Barbette Allyne Michelina, MD 08/30/2023 1527   6 : 11R Anterior Tissue Lymph Node SURGICAL PATHOLOGY EXAM Barbette Allyne Michelina, MD 08/30/2023 1544   7 : 12R Tissue Lymph Node SURGICAL PATHOLOGY ERASMO Barbette Allyne Michelina, MD 08/30/2023 1611   8 : 12R Posterior Tissue Lymph Node SURGICAL PATHOLOGY ERASMO Barbette Allyne Michelina, MD 08/30/2023 1633   9 : 13R Tissue Lymph Node SURGICAL PATHOLOGY EXAM Barbette Allyne Michelina, MD 08/30/2023 1705   10 : Right upper lobe diagnosis and margin at stitch Tissue Lung, Right Upper Lobe SURGICAL PATHOLOGY ERASMO Barbette Allyne Michelina, MD 08/30/2023 1804   11 : Right middle lobe wedge Tissue Lung, Right Middle Lobe SURGICAL PATHOLOGY ERASMO Barbette Allyne Michelina, MD 08/30/2023 1823      Complications: none.   Attending Attestation:  I was present for the entire procedure.   Allyne LOISE Barbette, MD Thoracic Surgery

## 2024-07-28 ENCOUNTER — Emergency Department
Admission: EM | Admit: 2024-07-28 | Discharge: 2024-07-28 | Disposition: A | Discharge status: Home / Self Care | Attending: Emergency Medicine | Admitting: Emergency Medicine

## 2024-07-28 ENCOUNTER — Other Ambulatory Visit: Payer: Self-pay

## 2024-07-28 ENCOUNTER — Emergency Department

## 2024-07-28 DIAGNOSIS — R0789 Other chest pain: Secondary | ICD-10-CM | POA: Diagnosis present

## 2024-07-28 DIAGNOSIS — R079 Chest pain, unspecified: Secondary | ICD-10-CM

## 2024-07-28 DIAGNOSIS — R0602 Shortness of breath: Secondary | ICD-10-CM | POA: Diagnosis not present

## 2024-07-28 LAB — BASIC METABOLIC PANEL WITH GFR
Anion gap: 9 (ref 5–15)
BUN: 24 mg/dL — ABNORMAL HIGH (ref 8–23)
CO2: 26 mmol/L (ref 22–32)
Calcium: 9 mg/dL (ref 8.9–10.3)
Chloride: 104 mmol/L (ref 98–111)
Creatinine, Ser: 1.49 mg/dL — ABNORMAL HIGH (ref 0.44–1.00)
GFR, Estimated: 39 mL/min — ABNORMAL LOW (ref >60)
Glucose, Bld: 93 mg/dL (ref 70–99)
Potassium: 4.1 mmol/L (ref 3.5–5.1)
Sodium: 139 mmol/L (ref 135–145)

## 2024-07-28 LAB — CBC
HCT: 36.6 % (ref 36.0–46.0)
Hemoglobin: 11.8 g/dL — ABNORMAL LOW (ref 12.0–15.0)
MCH: 28.9 pg (ref 26.0–34.0)
MCHC: 32.2 g/dL (ref 30.0–36.0)
MCV: 89.5 fL (ref 80.0–100.0)
Platelets: 265 K/uL (ref 150–400)
RBC: 4.09 MIL/uL (ref 3.87–5.11)
RDW: 13 % (ref 11.5–15.5)
WBC: 6.6 K/uL (ref 4.0–10.5)
nRBC: 0 % (ref 0.0–0.2)

## 2024-07-28 LAB — TROPONIN I (HIGH SENSITIVITY)
Troponin I (High Sensitivity): 3 ng/L (ref <18)
Troponin I (High Sensitivity): 3 ng/L (ref <18)

## 2024-07-28 NOTE — ED Triage Notes (Signed)
 Pt comes with cp that started sudden today. Pt came via EMS. Pt states she thought was indigestion at first but pressure got worse . Pt states it radiates to her back. Pt took tums with some relief. Pt states she also took 650 of aspirin.

## 2024-07-28 NOTE — ED Provider Notes (Signed)
 [facility] Provider Note    Event Date/Time   First MD Initiated Contact with Patient 07/28/24 1501     (approximate)   History   Chief Complaint Chest Pain   HPI  Desiree Burns is a 63 y.o. female with past medical history of IBS and Bell's palsy who presents to the ED complaining of chest pain.  Patient reports that earlier today, about 4 hours prior to arrival, she developed sudden onset squeezing and tight pain in the center of her chest.  She states she was sitting doing work at that time, denies exerting herself with the symptoms.  She was feeling short of breath with the chest discomfort, but both chest pain and shortness of breath resolved after about 15 minutes.  She denies any recent fevers or cough and has not had any pain or swelling in her legs.  She denies any personal cardiac history, does report strong family history of ACS.     Physical Exam   Triage Vital Signs: ED Triage Vitals  Encounter Vitals Group     BP 07/28/24 1154 (!) 122/37     Girls Systolic BP Percentile --      Girls Diastolic BP Percentile --      Boys Systolic BP Percentile --      Boys Diastolic BP Percentile --      Pulse Rate 07/28/24 1154 81     Resp 07/28/24 1154 18     Temp 07/28/24 1154 99 F (37.2 C)     Temp src --      SpO2 07/28/24 1154 100 %     Weight 07/28/24 1153 125 lb (56.7 kg)     Height 07/28/24 1153 5' 6 (1.676 m)     Head Circumference --      Peak Flow --      Pain Score 07/28/24 1153 0     Pain Loc --      Pain Education --      Exclude from Growth Chart --     Most recent vital signs: Vitals:   07/28/24 1154  BP: (!) 122/37  Pulse: 81  Resp: 18  Temp: 99 F (37.2 C)  SpO2: 100%    Constitutional: Alert and oriented. Eyes: Conjunctivae are normal. Head: Atraumatic. Nose: No congestion/rhinnorhea. Mouth/Throat: Mucous membranes are moist.  Cardiovascular: Normal rate, regular rhythm. Grossly normal heart sounds.  2+ radial  pulses bilaterally. Respiratory: Normal respiratory effort.  No retractions. Lungs CTAB.  No chest wall tenderness to palpation. Gastrointestinal: Soft and nontender. No distention. Musculoskeletal: No lower extremity tenderness nor edema.  Neurologic:  Normal speech and language. No gross focal neurologic deficits are appreciated.    ED Results / Procedures / Treatments   Labs (all labs ordered are listed, but only abnormal results are displayed) Labs Reviewed  BASIC METABOLIC PANEL WITH GFR - Abnormal; Notable for the following components:      Result Value   BUN 24 (*)    Creatinine, Ser 1.49 (*)    GFR, Estimated 39 (*)    All other components within normal limits  CBC - Abnormal; Notable for the following components:   Hemoglobin 11.8 (*)    All other components within normal limits  TROPONIN I (HIGH SENSITIVITY)  TROPONIN I (HIGH SENSITIVITY)     EKG  ED ECG REPORT I, Carlin Palin, the attending physician, personally viewed and interpreted this ECG.   Date: 07/28/2024  EKG Time: 11:51  Rate: 81  Rhythm: normal sinus rhythm  Axis: Normal  Intervals:none  ST&T Change: None  RADIOLOGY Chest x-ray reviewed and interpreted by me with no infiltrate, edema, or effusion.  PROCEDURES:  Critical Care performed: No  Procedures   MEDICATIONS ORDERED IN ED: Medications - No data to display   IMPRESSION / MDM / ASSESSMENT AND PLAN / ED COURSE  I reviewed the triage vital signs and the nursing notes.                              63 y.o. female with past medical history of IBS and Bell's palsy who presents to the ED for sudden onset squeezing chest discomfort earlier this morning, since resolved.  Patient's presentation is most consistent with acute presentation with potential threat to life or bodily function.  Differential diagnosis includes, but is not limited to, ACS, PE, pneumonia, pneumothorax, musculoskeletal pain, GERD, anxiety.  Patient  nontoxic-appearing and in no acute distress, vital signs are unremarkable.  EKG shows no evidence of arrhythmia or ischemia and initial troponin within normal limits, will check second set troponin given acute onset.  Chest x-ray is unremarkable and remainder of labs are reassuring with no significant anemia, leukocytosis, or electrolyte abnormality.  Creatinine is elevated but comparable to previous labs.  Repeat troponin within normal limits, patient continues to be chest pain-free here in the ED.  She is appropriate for discharge home with outpatient PCP and cardiology follow-up, was counseled to return to the ED for new or worsening symptoms.  Patient agrees with plan.      FINAL CLINICAL IMPRESSION(S) / ED DIAGNOSES   Final diagnoses:  Nonspecific chest pain     Rx / DC Orders   ED Discharge Orders     None        Note:  This document was prepared using Dragon voice recognition software and may include unintentional dictation errors.   Willo Dunnings, MD 07/28/24 [phone]
